# Patient Record
Sex: Male | Born: 1990 | Race: White | Marital: Single | Smoking: Never smoker
Health system: Southern US, Community
[De-identification: ages and names within clinical notes are randomized; demographics above are authoritative.]

---

## 2016-08-22 ENCOUNTER — Encounter: Payer: Self-pay | Admitting: Emergency Medicine

## 2016-08-22 ENCOUNTER — Emergency Department
Admission: EM | Admit: 2016-08-22 | Discharge: 2016-08-22 | Disposition: A | Discharge status: Home / Self Care | Payer: 59 | Attending: Emergency Medicine | Admitting: Emergency Medicine

## 2016-08-22 ENCOUNTER — Emergency Department: Payer: 59

## 2016-08-22 DIAGNOSIS — S9031XA Contusion of right foot, initial encounter: Secondary | ICD-10-CM | POA: Diagnosis not present

## 2016-08-22 DIAGNOSIS — Y9367 Activity, basketball: Secondary | ICD-10-CM | POA: Diagnosis not present

## 2016-08-22 DIAGNOSIS — X501XXA Overexertion from prolonged static or awkward postures, initial encounter: Secondary | ICD-10-CM | POA: Diagnosis not present

## 2016-08-22 DIAGNOSIS — Y929 Unspecified place or not applicable: Secondary | ICD-10-CM | POA: Diagnosis not present

## 2016-08-22 DIAGNOSIS — Y998 Other external cause status: Secondary | ICD-10-CM | POA: Diagnosis not present

## 2016-08-22 DIAGNOSIS — S99921A Unspecified injury of right foot, initial encounter: Secondary | ICD-10-CM | POA: Diagnosis present

## 2016-08-22 MED ORDER — IBUPROFEN 600 MG PO TABS: 600.0000 mg | ORAL_TABLET | Freq: Three times a day (TID) | ORAL | 0 refills | Status: AC | PRN

## 2016-08-22 NOTE — ED Provider Notes (Signed)
[facility] Emergency Department Provider Note   ____________________________________________   First MD Initiated Contact with Patient 08/22/16 [phone]     (approximate)  I have reviewed the triage vital signs and the nursing notes.   HISTORY  Chief Complaint Foot Pain    HPI Jonathon Henderson is a 26 y.o. male is here with  complaint of right foot pain. Patient states that he was playing basketball and experienced pain to his foot. He denies any previous foot problems. He states that's morning when he tried to walk he cannot put pressure on it without pain. He has taken ibuprofen 400 mg this morning.Patient states that he believes he twisted his foot when he "landed wrong". He denies any other injury to his body. He rates his pain as a 6/10.   History reviewed. No pertinent past medical history.  There are no active problems to display for this patient.   History reviewed. No pertinent surgical history.  Prior to Admission medications   Medication Sig Start Date End Date Taking? Authorizing Provider  ibuprofen (ADVIL,MOTRIN) 600 MG tablet Take 1 tablet (600 mg total) by mouth every 8 (eight) hours as needed. 08/22/16   Tommi Rumpshonda L Marsella Suman, PA-C    Allergies Patient has no known allergies.  No family history on file.  Social History Social History  Substance Use Topics  . Smoking status: Never Smoker  . Smokeless tobacco: Never Used  . Alcohol use Yes    Review of Systems Constitutional: No fever/chills Eyes: No visual changes. ENT: No trauma Cardiovascular: Denies chest pain. Respiratory: Denies shortness of breath. Gastrointestinal:   No nausea, no vomiting.   Genitourinary: Negative for dysuria. Musculoskeletal: Positive right foot pain. Skin: Negative for rash. Neurological: Negative for headaches, focal weakness or numbness.  10-point ROS otherwise negative.  ____________________________________________   PHYSICAL EXAM:  VITAL  SIGNS: ED Triage Vitals  Enc Vitals Group     BP 08/22/16 0714 140/60     Pulse Rate 08/22/16 0714 72     Resp 08/22/16 0714 18     Temp 08/22/16 0714 98.2 F (36.8 C)     Temp Source 08/22/16 0714 Oral     SpO2 08/22/16 0714 98 %     Weight 08/22/16 0710 180 lb (81.6 kg)     Height 08/22/16 0710 5\' 8"  (1.727 m)     Head Circumference --      Peak Flow --      Pain Score 08/22/16 0710 6     Pain Loc --      Pain Edu? --      Excl. in GC? --     Constitutional: Alert and oriented. Well appearing and in no acute distress. Eyes: Conjunctivae are normal. PERRL. EOMI. Head: Atraumatic. Nose: No congestion/rhinnorhea. Neck: No stridor.   Cardiovascular: Normal rate, regular rhythm. Grossly normal heart sounds.  Good peripheral circulation. Respiratory: Normal respiratory effort.  No retractions. Lungs CTAB. Musculoskeletal: Right foot no gross deformity was noted. There is minimal soft tissue swelling dorsal aspect of the right foot. Digits move with in normal limits. Capillary refill is less than 3 seconds. Skin is without abrasion, ecchymosis or erythema. Motor sensory function intact. There is some tenderness on palpation of the first, second, and third metatarsal. Neurologic:  Normal speech and language. No gross focal neurologic deficits are appreciated. No gait instability. Skin:  Skin is warm, dry and intact. No rash noted. Psychiatric: Mood and affect are normal. Speech and behavior are normal.  ____________________________________________   LABS (all labs ordered are listed, but only abnormal results are displayed)  Labs Reviewed - No data to display  RADIOLOGY  Right foot per radiologist shows no acute joint or bony abnormality. I, Tommi Rumps, personally viewed and evaluated these images (plain radiographs) as part of my medical decision making, as well as reviewing the written report by the  radiologist. ____________________________________________   PROCEDURES  Procedure(s) performed: None  Procedures  Critical Care performed: No  ____________________________________________   INITIAL IMPRESSION / ASSESSMENT AND PLAN / ED COURSE  Pertinent labs & imaging results that were available during my care of the patient were reviewed by me and considered in my medical decision making (see chart for details).  Patient was made aware that his x-ray was negative for fracture. Patient was given a prescription for ibuprofen 600 mg 3 times a day with food. Patient is follow-up at [facility] if any continued problems. At this time he did not want to get an Ace wrap for his foot but prefer to wear his regular shoes.      ____________________________________________   FINAL CLINICAL IMPRESSION(S) / ED DIAGNOSES  Final diagnoses:  Contusion of right foot, initial encounter      NEW MEDICATIONS STARTED DURING THIS VISIT:  Discharge Medication List as of 08/22/2016  8:12 AM    START taking these medications   Details  ibuprofen (ADVIL,MOTRIN) 600 MG tablet Take 1 tablet (600 mg total) by mouth every 8 (eight) hours as needed., Starting Sat 08/22/2016, Print         Note:  This document was prepared using Dragon voice recognition software and may include unintentional dictation errors.    Tommi Rumps, PA-C 08/22/16 1610    Myrna Blazer, MD 08/22/16 [phone]

## 2016-08-22 NOTE — ED Notes (Signed)
Pt alert and oriented X4, active, cooperative, pt in NAD. RR even and unlabored, color WNL.  Pt informed to return if any life threatening symptoms occur.   

## 2016-08-22 NOTE — ED Triage Notes (Signed)
Pt to ed with c/o right foot pain after playing basketball last night.  Pt states twisted foot and landed wrong. Pt reports he is unable to bear weight on foot at this time.

## 2016-08-22 NOTE — Discharge Instructions (Signed)
Ice and elevate as needed for swelling. Ibuprofen 600 mg 3 times a day with food. No sports for 2 weeks. [facility] any [facility] problems.

## 2017-10-29 IMAGING — DX DG FOOT COMPLETE 3+V*R*
3 series · 3 of 3 positions shown · non-contrast
Comparison: None.

CLINICAL DATA: Hyperflexion basketball injury. Tenderness and
swelling along the dorsum of the right foot, mainly around the first
metatarsal. Pain with weight-bearing. Initial encounter.

EXAM:
RIGHT FOOT COMPLETE - 3+ VIEW

[foot ap]
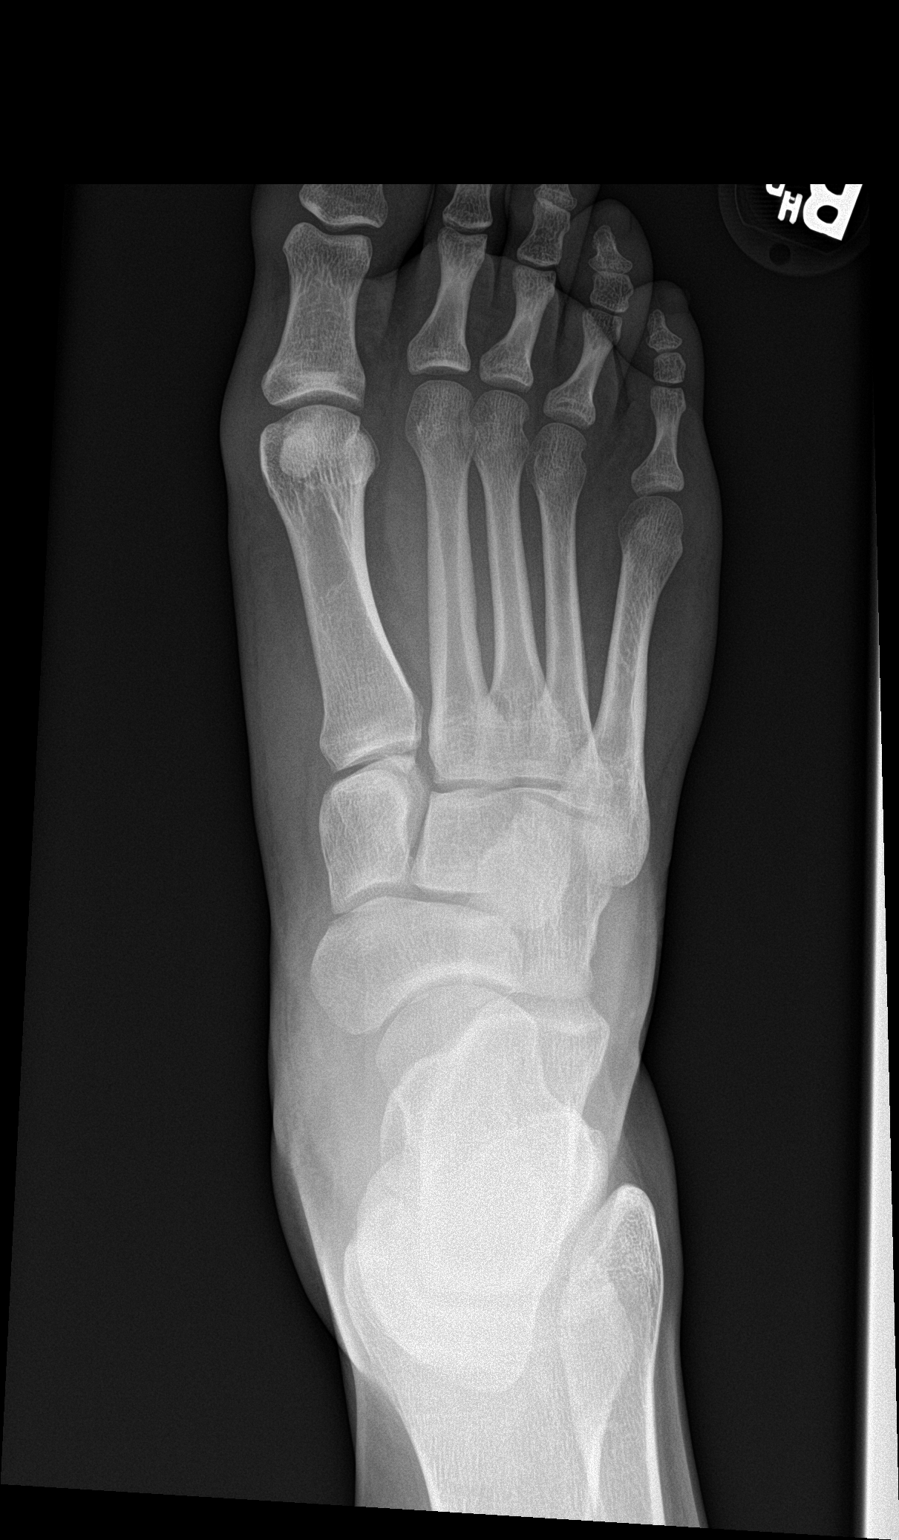

[foot obl]
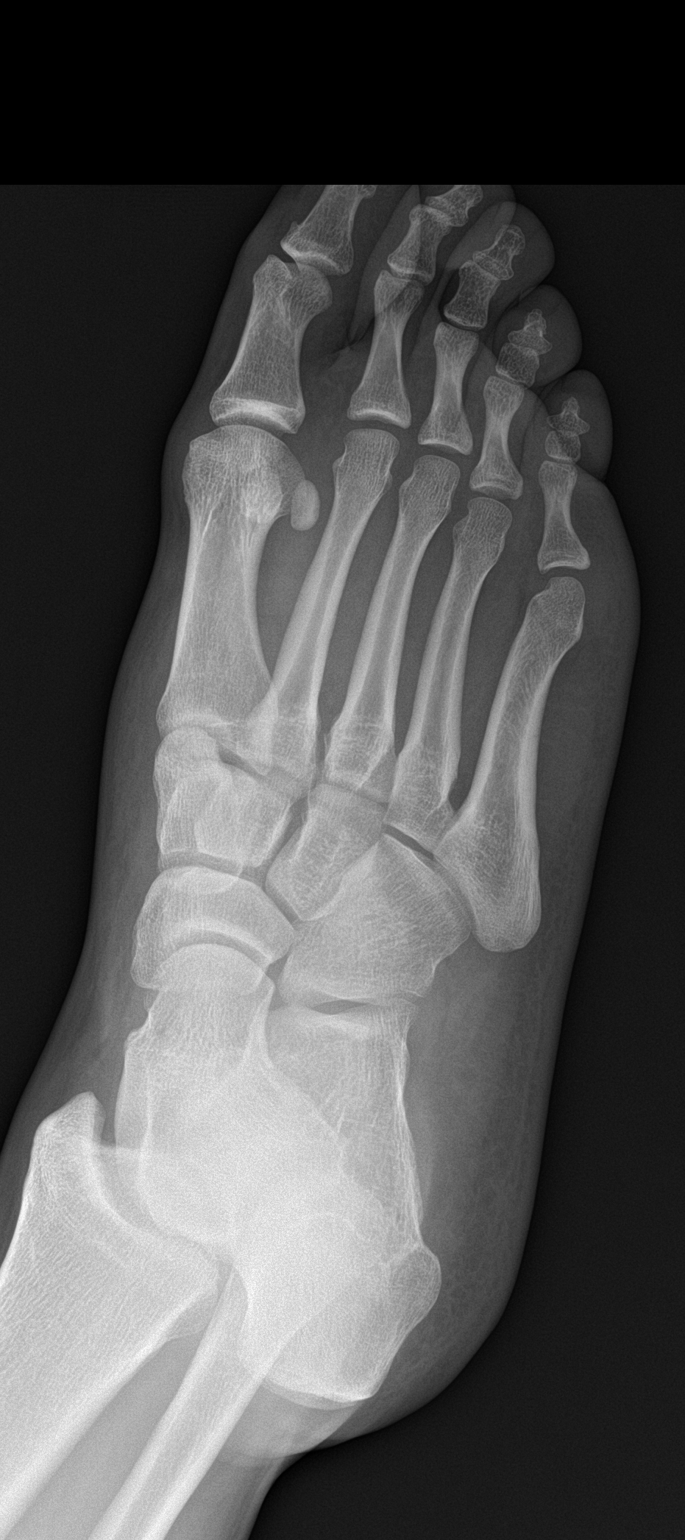

[foot lat]
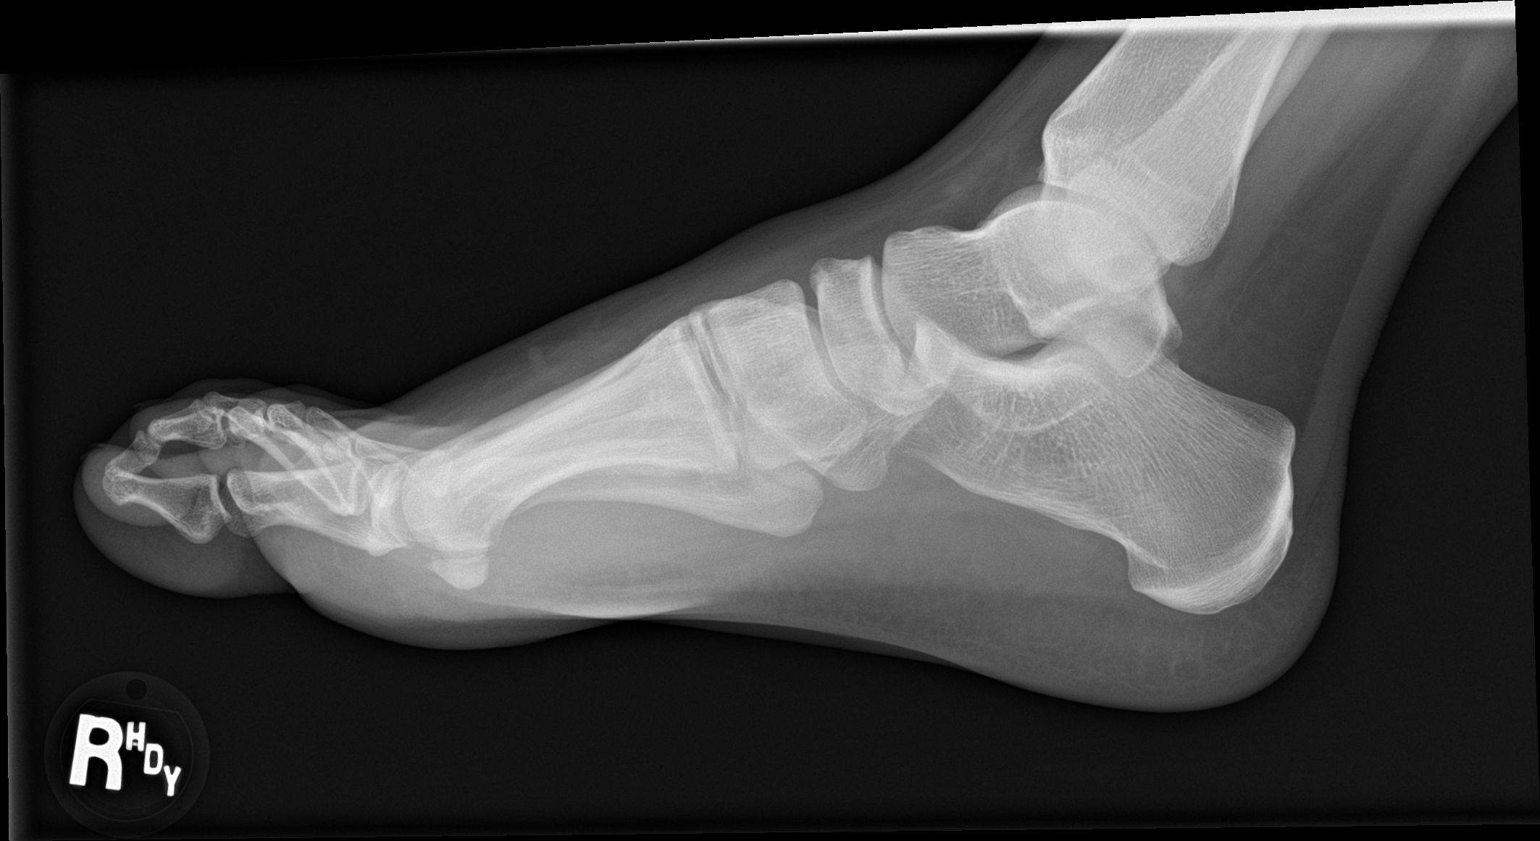

[3 of 3 positions shown; findings below may reference images not displayed]

FINDINGS: No acute osseous or joint abnormality. There may be mild forefoot
soft tissue swelling.
IMPRESSION: No acute osseous or joint abnormality.
